# Patient Record
Sex: Female | Born: 1995 | Race: Black or African American | Hispanic: No | Marital: Single | Smoking: Never smoker
Health system: Southern US, Community
[De-identification: ages and names within clinical notes are randomized; demographics above are authoritative.]

## PROBLEM LIST (undated history)

## (undated) DIAGNOSIS — L309 Dermatitis, unspecified: Secondary | ICD-10-CM

## (undated) HISTORY — PX: HERNIA REPAIR: SHX51

---
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
## 2014-05-18 ENCOUNTER — Emergency Department (HOSPITAL_COMMUNITY)
Admission: EM | Admit: 2014-05-18 | Discharge: 2014-05-18 | Disposition: A | Discharge status: Home / Self Care | Payer: No Typology Code available for payment source | Attending: Emergency Medicine | Admitting: Emergency Medicine

## 2014-05-18 ENCOUNTER — Encounter (HOSPITAL_COMMUNITY): Payer: Self-pay | Admitting: Emergency Medicine

## 2014-05-18 DIAGNOSIS — S161XXA Strain of muscle, fascia and tendon at neck level, initial encounter: Secondary | ICD-10-CM | POA: Diagnosis not present

## 2014-05-18 DIAGNOSIS — Z872 Personal history of diseases of the skin and subcutaneous tissue: Secondary | ICD-10-CM | POA: Insufficient documentation

## 2014-05-18 DIAGNOSIS — Y9389 Activity, other specified: Secondary | ICD-10-CM | POA: Diagnosis not present

## 2014-05-18 DIAGNOSIS — S199XXA Unspecified injury of neck, initial encounter: Secondary | ICD-10-CM | POA: Diagnosis present

## 2014-05-18 DIAGNOSIS — Y92410 Unspecified street and highway as the place of occurrence of the external cause: Secondary | ICD-10-CM | POA: Insufficient documentation

## 2014-05-18 DIAGNOSIS — S3992XA Unspecified injury of lower back, initial encounter: Secondary | ICD-10-CM | POA: Insufficient documentation

## 2014-05-18 DIAGNOSIS — Z79818 Long term (current) use of other agents affecting estrogen receptors and estrogen levels: Secondary | ICD-10-CM | POA: Diagnosis not present

## 2014-05-18 MED ORDER — IBUPROFEN 800 MG PO TABS: 800.0000 mg | ORAL_TABLET | Freq: Three times a day (TID) | ORAL | Status: AC

## 2014-05-18 MED ORDER — CYCLOBENZAPRINE HCL 10 MG PO TABS: 10.0000 mg | ORAL_TABLET | Freq: Two times a day (BID) | ORAL | Status: AC | PRN

## 2014-05-18 NOTE — ED Notes (Signed)
Pt denies sx other than pain, denies nausea, sob, dizziness, numbness /tingling or other sx. [facility]

## 2014-05-18 NOTE — ED Provider Notes (Signed)
Medical screening examination/treatment/procedure(s) were performed by non-physician practitioner and as supervising physician I was immediately available for consultation/collaboration.   Ceazia Harb, MD 05/18/14 0615 
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
## 2014-05-18 NOTE — ED Notes (Signed)
EDPA into room 

## 2014-05-18 NOTE — ED Notes (Signed)
Out with steady gait, "ready to go", declines w/c, denies questions or needs.

## 2014-05-18 NOTE — Discharge Instructions (Signed)
Cervical Sprain °A cervical sprain is an injury in the neck in which the strong, fibrous tissues (ligaments) that connect your neck bones stretch or tear. Cervical sprains can range from mild to severe. Severe cervical sprains can cause the neck vertebrae to be unstable. This can lead to damage of the spinal cord and can result in serious nervous system problems. The amount of time it takes for a cervical sprain to get better depends on the cause and extent of the injury. Most cervical sprains heal in 1 to 3 weeks. °CAUSES  °Severe cervical sprains may be caused by:  °· Contact sport injuries (such as from football, rugby, wrestling, hockey, auto racing, gymnastics, diving, martial arts, or boxing).   °· Motor vehicle collisions.   °· Whiplash injuries. This is an injury from a sudden forward and backward whipping movement of the head and neck.  °· Falls.   °Mild cervical sprains may be caused by:  °· Being in an awkward position, such as while cradling a telephone between your ear and shoulder.   °· Sitting in a chair that does not offer proper support.   °· Working at a poorly designed computer station.   °· Looking up or down for long periods of time.   °SYMPTOMS  °· Pain, soreness, stiffness, or a burning sensation in the front, back, or sides of the neck. This discomfort may develop immediately after the injury or slowly, 24 hours or more after the injury.   °· Pain or tenderness directly in the middle of the back of the neck.   °· Shoulder or upper back pain.   °· Limited ability to move the neck.   °· Headache.   °· Dizziness.   °· Weakness, numbness, or tingling in the hands or arms.   °· Muscle spasms.   °· Difficulty swallowing or chewing.   °· Tenderness and swelling of the neck.   °DIAGNOSIS  °Most of the time your health care provider can diagnose a cervical sprain by taking your history and doing a physical exam. Your health care provider will ask about previous neck injuries and any known neck  problems, such as arthritis in the neck. X-rays may be taken to find out if there are any other problems, such as with the bones of the neck. Other tests, such as a CT scan or MRI, may also be needed.  °TREATMENT  °Treatment depends on the severity of the cervical sprain. Mild sprains can be treated with rest, keeping the neck in place (immobilization), and pain medicines. Severe cervical sprains are immediately immobilized. Further treatment is done to help with pain, muscle spasms, and other symptoms and may include: °· Medicines, such as pain relievers, numbing medicines, or muscle relaxants.   °· Physical therapy. This may involve stretching exercises, strengthening exercises, and posture training. Exercises and improved posture can help stabilize the neck, strengthen muscles, and help stop symptoms from returning.   °HOME CARE INSTRUCTIONS  °· Put ice on the injured area.   °¨ Put ice in a plastic bag.   °¨ Place a towel between your skin and the bag.   °¨ Leave the ice on for 15-20 minutes, 3-4 times a day.   °· If your injury was severe, you may have been given a cervical collar to wear. A cervical collar is a two-piece collar designed to keep your neck from moving while it heals. °¨ Do not remove the collar unless instructed by your health care provider. °¨ If you have long hair, keep it outside of the collar. °¨ Ask your health care provider before making any adjustments to your collar. Minor   adjustments may be required over time to improve comfort and reduce pressure on your chin or on the back of your head.  Ifyou are allowed to remove the collar for cleaning or bathing, follow your health care provider's instructions on how to do so safely.  Keep your collar clean by wiping it with mild soap and water and drying it completely. If the collar you have been given includes removable pads, remove them every 1-2 days and hand wash them with soap and water. Allow them to air dry. They should be completely  dry before you wear them in the collar.  If you are allowed to remove the collar for cleaning and bathing, wash and dry the skin of your neck. Check your skin for irritation or sores. If you see any, tell your health care provider.  Do not drive while wearing the collar.   Only take over-the-counter or prescription medicines for pain, discomfort, or fever as directed by your health care provider.   Keep all follow-up appointments as directed by your health care provider.   Keep all physical therapy appointments as directed by your health care provider.   Make any needed adjustments to your workstation to promote good posture.   Avoid positions and activities that make your symptoms worse.   Warm up and stretch before being active to help prevent problems.  SEEK MEDICAL CARE IF:   Your pain is not controlled with medicine.   You are unable to decrease your pain medicine over time as planned.   Your activity level is not improving as expected.  SEEK IMMEDIATE MEDICAL CARE IF:   You develop any bleeding.  You develop stomach upset.  You have signs of an allergic reaction to your medicine.   Your symptoms get worse.   You develop new, unexplained symptoms.   You have numbness, tingling, weakness, or paralysis in any part of your body.  MAKE SURE YOU:   Understand these instructions.  Will watch your condition.  Will get help right away if you are not doing well or get worse. Document Released: 05/14/2007 Document Revised: 07/22/2013 Document Reviewed: 01/22/2013 [facility] LP[facility] Patient Information 2015 [place] Center[place]Care, [place]. This information is not intended to replace advice given to you by your health care provider. Make sure you discuss any questions you have with your health care provider. Motor Vehicle Collision It is common to have multiple bruises and sore muscles after a motor vehicle collision (MVC). These tend to feel worse for the first 24 hours. You may have the  most stiffness and soreness over the first several hours. You may also feel worse when you wake up the first morning after your collision. After this point, you will usually begin to improve with each day. The speed of improvement often depends on the severity of the collision, the number of injuries, and the location and nature of these injuries. HOME CARE INSTRUCTIONS  Put ice on the injured area.  Put ice in a plastic bag.  Place a towel between your skin and the bag.  Leave the ice on for 15-20 minutes, 3-4 times a day, or as directed by your health care provider.  Drink enough fluids to keep your urine clear or pale yellow. Do not drink alcohol.  Take a warm shower or bath once or twice a day. This will increase blood flow to sore muscles.  You may return to activities as directed by your caregiver. Be careful when lifting, as this may aggravate neck or back pain.  Only take over-the-counter or prescription medicines for pain, discomfort, or fever as directed by your caregiver. Do not use aspirin. This may increase bruising and bleeding. SEEK IMMEDIATE MEDICAL CARE IF:  You have numbness, tingling, or weakness in the arms or legs.  You develop severe headaches not relieved with medicine.  You have severe neck pain, especially tenderness in the middle of the back of your neck.  You have changes in bowel or bladder control.  There is increasing pain in any area of the body.  You have shortness of breath, light-headedness, dizziness, or fainting.  You have chest pain.  You feel sick to your stomach (nauseous), throw up (vomit), or sweat.  You have increasing abdominal discomfort.  There is blood in your urine, stool, or vomit.  You have pain in your shoulder (shoulder strap areas).  You feel your symptoms are getting worse. MAKE SURE YOU:  Understand these instructions.  Will watch your condition.  Will get help right away if you are not doing well or get  worse. Document Released: 07/17/2005 Document Revised: 12/01/2013 Document Reviewed: 12/14/2010 [facility] Patient Information 2015 [place], [place]. This information is not intended to replace advice given to you by your health care provider. Make sure you discuss any questions you have with your health care provider. Heat Therapy Heat therapy can help ease sore, stiff, injured, and tight muscles and joints. Heat relaxes your muscles, which may help ease your pain.  RISKS AND COMPLICATIONS If you have any of the following conditions, do not use heat therapy unless your health care provider has approved:  Poor circulation.  Healing wounds or scarred skin in the area being treated.  Diabetes, heart disease, or high blood pressure.  Not being able to feel (numbness) the area being treated.  Unusual swelling of the area being treated.  Active infections.  Blood clots.  Cancer.  Inability to communicate pain. This may include young children and people who have problems with their brain function (dementia).  Pregnancy. Heat therapy should only be used on old, pre-existing, or long-lasting (chronic) injuries. Do not use heat therapy on new injuries unless directed by your health care provider. HOW TO USE HEAT THERAPY There are several different kinds of heat therapy, including:  Moist heat pack.  Warm water bath.  Hot water bottle.  Electric heating pad.  Heated gel pack.  Heated wrap.  Electric heating pad. Use the heat therapy method suggested by your health care provider. Follow your health care provider's instructions on when and how to use heat therapy. GENERAL HEAT THERAPY RECOMMENDATIONS  Do not sleep while using heat therapy. Only use heat therapy while you are awake.  Your skin may turn pink while using heat therapy. Do not use heat therapy if your skin turns red.  Do not use heat therapy if you have new pain.  High heat or long exposure to heat can cause burns.  Be careful when using heat therapy to avoid burning your skin.  Do not use heat therapy on areas of your skin that are already irritated, such as with a rash or sunburn. SEEK MEDICAL CARE IF:  You have blisters, redness, swelling, or numbness.  You have new pain.  Your pain is worse. MAKE SURE YOU:  Understand these instructions.  Will watch your condition.  Will get help right away if you are not doing well or get worse. Document Released: 10/09/2011 Document Revised: 12/01/2013 Document Reviewed: 09/09/2013 [facility]oscopyExit[facility] Patient Information 2015 [place]ExitCare, [place]. This information is  not intended to replace advice given to you by your health care provider. Make sure you discuss any questions you have with your health care provider. ° °
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
## 2016-06-03 ENCOUNTER — Emergency Department (HOSPITAL_COMMUNITY): Payer: BLUE CROSS/BLUE SHIELD

## 2016-06-03 ENCOUNTER — Encounter (HOSPITAL_COMMUNITY): Payer: Self-pay | Admitting: Emergency Medicine

## 2016-06-03 ENCOUNTER — Emergency Department (HOSPITAL_COMMUNITY)
Admission: EM | Admit: 2016-06-03 | Discharge: 2016-06-03 | Disposition: A | Discharge status: Home / Self Care | Payer: BLUE CROSS/BLUE SHIELD | Attending: Emergency Medicine | Admitting: Emergency Medicine

## 2016-06-03 DIAGNOSIS — K5900 Constipation, unspecified: Secondary | ICD-10-CM | POA: Diagnosis not present

## 2016-06-03 DIAGNOSIS — R102 Pelvic and perineal pain: Secondary | ICD-10-CM

## 2016-06-03 DIAGNOSIS — R1031 Right lower quadrant pain: Secondary | ICD-10-CM | POA: Diagnosis present

## 2016-06-03 LAB — CBC
HCT: 44.4 % (ref 36.0–46.0)
Hemoglobin: 14.5 g/dL (ref 12.0–15.0)
MCH: 26.1 pg (ref 26.0–34.0)
MCHC: 32.7 g/dL (ref 30.0–36.0)
MCV: 79.9 fL (ref 78.0–100.0)
PLATELETS: 301 10*3/uL (ref 150–400)
RBC: 5.56 MIL/uL — AB (ref 3.87–5.11)
RDW: 14.9 % (ref 11.5–15.5)
WBC: 9.2 10*3/uL (ref 4.0–10.5)

## 2016-06-03 LAB — COMPREHENSIVE METABOLIC PANEL
ALK PHOS: 80 U/L (ref 38–126)
ALT: 11 U/L — ABNORMAL LOW (ref 14–54)
AST: 17 U/L (ref 15–41)
Albumin: 3.1 g/dL — ABNORMAL LOW (ref 3.5–5.0)
Anion gap: 9 (ref 5–15)
BUN: 7 mg/dL (ref 6–20)
CALCIUM: 10 mg/dL (ref 8.9–10.3)
CHLORIDE: 104 mmol/L (ref 101–111)
CO2: 24 mmol/L (ref 22–32)
CREATININE: 0.83 mg/dL (ref 0.44–1.00)
Glucose, Bld: 99 mg/dL (ref 65–99)
Potassium: 3.9 mmol/L (ref 3.5–5.1)
Sodium: 137 mmol/L (ref 135–145)
Total Bilirubin: 0.4 mg/dL (ref 0.3–1.2)
Total Protein: 7.1 g/dL (ref 6.5–8.1)

## 2016-06-03 LAB — URINE MICROSCOPIC-ADD ON

## 2016-06-03 LAB — URINALYSIS, ROUTINE W REFLEX MICROSCOPIC
Bilirubin Urine: NEGATIVE
GLUCOSE, UA: NEGATIVE mg/dL
KETONES UR: NEGATIVE mg/dL
Leukocytes, UA: NEGATIVE
Nitrite: NEGATIVE
PROTEIN: NEGATIVE mg/dL
Specific Gravity, Urine: 1.018 (ref 1.005–1.030)
pH: 6.5 (ref 5.0–8.0)

## 2016-06-03 LAB — WET PREP, GENITAL
CLUE CELLS WET PREP: NONE SEEN
SPERM: NONE SEEN
TRICH WET PREP: NONE SEEN
YEAST WET PREP: NONE SEEN

## 2016-06-03 LAB — I-STAT BETA HCG BLOOD, ED (MC, WL, AP ONLY)

## 2016-06-03 LAB — LIPASE, BLOOD: Lipase: 25 U/L (ref 11–51)

## 2016-06-03 MED ORDER — MORPHINE SULFATE (PF) 4 MG/ML IV SOLN
4.0000 mg | Freq: Once | INTRAVENOUS | Status: AC
  Administered 2016-06-03: 4 mg via INTRAVENOUS
  Filled 2016-06-03: qty 1

## 2016-06-03 MED ORDER — IOPAMIDOL (ISOVUE-300) INJECTION 61%
INTRAVENOUS | Status: AC
  Administered 2016-06-03: 100 mL
  Filled 2016-06-03: qty 100

## 2016-06-03 MED ORDER — POLYETHYLENE GLYCOL 3350 17 G PO PACK
17.0000 g | PACK | Freq: Every day | ORAL | 0 refills | Status: AC
Start: 2016-06-03 — End: ?

## 2016-06-03 MED ORDER — ONDANSETRON HCL 4 MG/2ML IJ SOLN
4.0000 mg | Freq: Once | INTRAMUSCULAR | Status: AC
  Administered 2016-06-03: 4 mg via INTRAVENOUS
  Filled 2016-06-03: qty 2

## 2016-06-03 MED ORDER — SODIUM CHLORIDE 0.9 % IV BOLUS (SEPSIS)
1000.0000 mL | Freq: Once | INTRAVENOUS | Status: AC
  Administered 2016-06-03: 1000 mL via INTRAVENOUS

## 2016-06-03 MED ORDER — DICYCLOMINE HCL 10 MG PO CAPS: 10.0000 mg | ORAL_CAPSULE | Freq: Three times a day (TID) | ORAL | 0 refills | Status: AC

## 2016-06-03 MED ORDER — METOCLOPRAMIDE HCL 10 MG PO TABS: 10.0000 mg | ORAL_TABLET | Freq: Three times a day (TID) | ORAL | 0 refills | Status: AC

## 2016-06-03 NOTE — ED Provider Notes (Signed)
[facility]-EMER[facility]ENCY DEPT Provider Note   CSN: [id number] Arrival date & time: 06/03/16  0844  History   Chief Complaint Chief Complaint  Patient presents with  . Abdominal Pain  . Emesis    HPI Erica Cochran is a 20 y.o. female presenting with emesis and abdominal pain.  HPI  Reports she woke up this morning around 7am with abdominal pain. Went to the restroom, but did not help with pain. Could not get comfortable. Pain worsened and moved from area of umbilicus to right lower quadrant. Pain is intermittent and sharp. Reports appetite is normal, but she became nauseous and vomited on arrival to the ED. Denies fever. Sexually active, but reports she uses condoms and birth control. Currently on her period. Denies diarrhea. Last bowel movement yesterday, normal consistency.   Past Medical History:  Diagnosis Date  . Eczema     There are no active problems to display for this patient.   Past Surgical History:  Procedure Laterality Date  . HERNIA REPAIR      OB History    No data available      Home Medications    Prior to Admission medications   Medication Sig Start Date End Date Taking? Authorizing Provider  Benzocaine (BOIL-EASE EX) Apply 1 application topically as needed (inner thighs).   Yes Historical Provider, MD  cyclobenzaprine (FLEXERIL) 10 MG tablet Take 1 tablet (10 mg total) by mouth 2 (two) times daily as needed for muscle spasms. Patient not taking: Reported on 06/03/2016 05/18/14   Elpidio Anis, PA-C  dicyclomine (BENTYL) 10 MG capsule Take 1 capsule (10 mg total) by mouth 4 (four) times daily -  before meals and at bedtime. 06/03/16   [place] N Rumley, DO  ibuprofen (ADVIL,MOTRIN) 800 MG tablet Take 1 tablet (800 mg total) by mouth 3 (three) times daily. Patient not taking: Reported on 06/03/2016 05/18/14   Elpidio Anis, PA-C  metoCLOPramide (REGLAN) 10 MG tablet Take 1 tablet (10 mg total) by mouth 4 (four) times daily -  before meals and at bedtime. 06/03/16    [place] N Rumley, DO  Norgestimate-Ethinyl Estradiol Triphasic (TRINESSA, 28,) 0.18/0.215/0.25 MG-35 MCG tablet Take 1 tablet by mouth daily.    Historical Provider, MD  polyethylene glycol (MIRALAX / GLYCOLAX) packet Take 17 g by mouth daily. 06/03/16   Araceli Bouche, DO    Family History No family history on file.  Social History Social History  Substance Use Topics  . Smoking status: Never Smoker  . Smokeless tobacco: Never Used  . Alcohol use 4.8 oz/week    8 Shots of liquor per week     Allergies   Sulfa antibiotics   Review of Systems Review of Systems  Constitutional: Negative for appetite change and fever.  Respiratory: Negative for shortness of breath.   Gastrointestinal: Positive for abdominal pain, nausea and vomiting. Negative for constipation and diarrhea.  Genitourinary: Negative for dysuria, menstrual problem, vaginal discharge and vaginal pain.     Physical Exam Updated Vital Signs BP 122/77   Pulse 92   Resp 17   Ht 5\' 3"  (1.6 m)   Wt 95.3 kg   LMP 05/27/2016 (Exact Date)   SpO2 100%   BMI 37.20 kg/m   Physical Exam  Constitutional: She appears well-developed and well-nourished.  Intermittently tearful when pain occurs  HENT:  Head: Normocephalic and atraumatic.  Cardiovascular: Normal rate and regular rhythm.  Exam reveals no friction rub.   No murmur heard. Pulmonary/Chest: Effort normal. No respiratory distress.  She has no wheezes.  Abdominal: Soft. Bowel sounds are normal. She exhibits no distension.  Diffuse tenderness worse over McBurney's. Positive Murphy's. Rovsing sign positive. Negative rebound. Positive Obturator sign, negative Psoas sign.  Genitourinary:  Genitourinary Comments: Cervix normal with mucous/bloody discharge from os (currently on period). No cervical motion tenderness. No adnexal tenderness. No vaginal wall tenderness.   Musculoskeletal: She exhibits no edema.  Lymphadenopathy:    She has no cervical adenopathy.    Skin: No rash noted.  Psychiatric: She has a normal mood and affect. Her behavior is normal.    ED Treatments / Results  Labs (all labs ordered are listed, but only abnormal results are displayed) Labs Reviewed  WET PREP, GENITAL - Abnormal; Notable for the following:       Result Value   WBC, Wet Prep HPF POC MODERATE (*)    All other components within normal limits  COMPREHENSIVE METABOLIC PANEL - Abnormal; Notable for the following:    Albumin 3.1 (*)    ALT 11 (*)    All other components within normal limits  CBC - Abnormal; Notable for the following:    RBC 5.56 (*)    All other components within normal limits  URINALYSIS, ROUTINE W REFLEX MICROSCOPIC (NOT AT [facility]) - Abnormal; Notable for the following:    Hgb urine dipstick MODERATE (*)    All other components within normal limits  URINE MICROSCOPIC-ADD ON - Abnormal; Notable for the following:    Squamous Epithelial / LPF 0-5 (*)    Bacteria, UA RARE (*)    All other components within normal limits  LIPASE, BLOOD  I-STAT BETA HCG BLOOD, ED ([facility], [facility], [facility] ONLY)  CERVICOVAGINAL ANCILLARY ONLY   EKG  EKG Interpretation None       Radiology [place] Transvaginal Non-ob  Result Date: 06/03/2016 CLINICAL DATA:  Ultrasound was provided for use by the ordering physician, and a technical charge was applied by the performing facility.  No radiologist interpretation/professional services rendered.   [place] Pelvis Complete  Result Date: 06/03/2016 CLINICAL DATA:  Menorrhagia.  Diffuse pelvic pain. EXAM: TRANSABDOMINAL AND TRANSVAGINAL ULTRASOUND OF PELVIS DOPPLER ULTRASOUND OF OVARIES TECHNIQUE: Both transabdominal and transvaginal ultrasound examinations of the pelvis were performed. Transabdominal technique was performed for global imaging of the pelvis including uterus, ovaries, adnexal regions, and pelvic cul-de-sac. It was necessary to proceed with endovaginal exam following the transabdominal exam to visualize the endometrium and  ovaries. Color and duplex Doppler ultrasound was utilized to evaluate blood flow to the ovaries. COMPARISON:  Pelvic CT 06/03/2016 FINDINGS: Uterus Measurements: 7.7 x 2.5 x 5.2 cm. No fibroids or other mass visualized. Normal anteverted position of the uterus. Endometrium Thickness: 0.8 cm.  No focal abnormality visualized. Right ovary Measurements: 3.4 x 2.2 x 1.3 cm. Normal appearance/no adnexal mass. Left ovary Measurements: 1.8 x 1.2 x 2.0 cm. Normal appearance/no adnexal mass. Pulsed Doppler evaluation of both ovaries demonstrates normal low-resistance arterial and venous waveforms. Other findings No significant free fluid. IMPRESSION: Normal pelvic ultrasound.  No evidence for ovarian torsion. Electronically Signed   By: Richarda OverlieAdam  Henn M.D.   On: 06/03/2016 13:38   Ct Abdomen Pelvis W Contrast  Result Date: 06/03/2016 CLINICAL DATA:  Right lower quadrant pain. EXAM: CT ABDOMEN AND PELVIS WITH CONTRAST TECHNIQUE: Multidetector CT imaging of the abdomen and pelvis was performed using the standard protocol following bolus administration of intravenous contrast. CONTRAST:  100mL ISOVUE-300 IOPAMIDOL (ISOVUE-300) INJECTION 61% COMPARISON:  None FINDINGS: Lower chest: Lung bases are  clear.  No pleural effusions. Hepatobiliary: Normal appearance of the liver, gallbladder and portal venous system. Pancreas: Normal appearance of the pancreas without inflammation or duct dilatation. Spleen: Normal appearance of spleen without enlargement. Adrenals/Urinary Tract: Normal adrenal glands. Small amount of contrast within the renal calices and limited evaluation for kidney stones. No suspicious renal lesions and no hydronephrosis. Left ureter jet is seen. Urinary bladder is unremarkable. Stomach/Bowel: Normal appearance of the stomach and duodenum. Normal appendix. No evidence for bowel dilatation or focal inflammation. Vascular/Lymphatic: No significant vascular findings are present. No enlarged abdominal or pelvic lymph  nodes. Reproductive: There is fluid surrounding the uterus and there is mild fat stranding just anterior to the uterine fundus. No gross abnormality to the adnexal structures. Incidentally, an intravaginal tampon device present. Other: Small amount of free fluid in the pelvis, particularly around the uterus. No significant abdominal wall hernia. Musculoskeletal: No acute bone abnormality. IMPRESSION: Small amount of stranding and fluid surrounding the uterus. Fluid could be physiologic but cannot exclude pelvic inflammatory disease and recommend clinical correlation in this area. If this is the area of concern, consider further imaging with pelvic ultrasound. Electronically Signed   By: Richarda Overlie M.D.   On: 06/03/2016 11:00   [place] Art/ven Flow Abd Pelv Doppler  Result Date: 06/03/2016 CLINICAL DATA:  Menorrhagia.  Diffuse pelvic pain. EXAM: TRANSABDOMINAL AND TRANSVAGINAL ULTRASOUND OF PELVIS DOPPLER ULTRASOUND OF OVARIES TECHNIQUE: Both transabdominal and transvaginal ultrasound examinations of the pelvis were performed. Transabdominal technique was performed for global imaging of the pelvis including uterus, ovaries, adnexal regions, and pelvic cul-de-sac. It was necessary to proceed with endovaginal exam following the transabdominal exam to visualize the endometrium and ovaries. Color and duplex Doppler ultrasound was utilized to evaluate blood flow to the ovaries. COMPARISON:  Pelvic CT 06/03/2016 FINDINGS: Uterus Measurements: 7.7 x 2.5 x 5.2 cm. No fibroids or other mass visualized. Normal anteverted position of the uterus. Endometrium Thickness: 0.8 cm.  No focal abnormality visualized. Right ovary Measurements: 3.4 x 2.2 x 1.3 cm. Normal appearance/no adnexal mass. Left ovary Measurements: 1.8 x 1.2 x 2.0 cm. Normal appearance/no adnexal mass. Pulsed Doppler evaluation of both ovaries demonstrates normal low-resistance arterial and venous waveforms. Other findings No significant free fluid. IMPRESSION:  Normal pelvic ultrasound.  No evidence for ovarian torsion. Electronically Signed   By: Richarda Overlie M.D.   On: 06/03/2016 13:38    Procedures Procedures (including critical care time)  Medications Ordered in ED Medications  sodium chloride 0.9 % bolus 1,000 mL (0 mLs Intravenous Stopped 06/03/16 1232)  morphine 4 MG/ML injection 4 mg (4 mg Intravenous Given 06/03/16 0937)  ondansetron (ZOFRAN) injection 4 mg (4 mg Intravenous Given 06/03/16 0938)  iopamidol (ISOVUE-300) 61 % injection (100 mLs  Contrast Given 06/03/16 1028)     Initial Impression / Assessment and Plan / ED Course  I have reviewed the triage vital signs and the nursing notes.  Pertinent labs & imaging results that were available during my care of the patient were reviewed by me and considered in my medical decision making (see chart for details).  Clinical Course  - CBC with normal white count at 9.2, hemoglobin normal at 14.5. - CMP normal except for mildly low albumin at 3.1. Lipase normal at 25.  - Pregnancy negative - Urinalysis with moderate hemoglobin, currently on period. - Wet Prep with moderate leukocytes, otherwise normal.  - GC/Chlamydia obtained and pending.  - CT Scan Abdomen and Pelvis with Contrast with fluid and stranding  surrounding uterus, which may be physiologic but could also indicate PID. Normal appendix. Stool burden on right. Recommended Pelvic US to further evaluate.  - Pelvic US normal including Transvagional and Doppler views.   - Made NPO on arrival to ED. - 1L NS bolus given. Morphine and Zofran as needed for pain and nausea respectively.  - Differential includes Appendicitis (McBurney's Tenderness, transition for umbilicus pain to RLQ pain, Obturator sign; CT with normal appendix), PID (fluid surrounding uterus on CT but none on US, no cervical motion tenderness, WBC on wet prep, GC/Chlamydia pending), constipation (stool burden noted over right abdomen, no signs of obstruction). Suspect  constipation as most likely given labs and imaging.   Final Clinical Impressions(s) / ED Diagnoses   Final diagnoses:  Constipation, unspecified constipation type  Workup unremarkable. Vitals stable. CT Scan with some stool burden on right side of abdomen. Suspect pain secondary to constipation. GC/Chlamycia pending at discharge. Prescription for Miralax, Reglan, and Bentyl given. Strict return precautions given, including fever, worsening abdominal pain, vomiting, lack of appetite. [facility].   New Prescriptions New Prescriptions   DICYCLOMINE (BENTYL) 10 MG CAPSULE    Take 1 capsule (10 mg total) by mouth 4 (four) times daily -  before meals and at bedtime.   METOCLOPRAMIDE (REGLAN) 10 MG TABLET    Take 1 tablet (10 mg total) by mouth 4 (four) times daily -  before meals and at bedtime.   POLYETHYLENE GLYCOL (MIRALAX / GLYCOLAX) PACKET    Take 17 g by mouth daily.     Lora HavensRaleigh N Green HarborRumley, [place]DO 06/03/16 1418    Loren Raceravid Yelverton, MD 06/05/16 1534

## 2016-06-03 NOTE — ED Notes (Signed)
Patient transported to CT 

## 2016-06-03 NOTE — ED Triage Notes (Signed)
Pt arrives from home c/o R sided abd pain with one episode of emesis.  Pt reports unable to have a BM today, reports normal BM last night.  Pt reports "some" ETOH use last night, denies excessive intake.  Pt reports LMP 05/28/16. Pt tearful at triage.

## 2016-06-03 NOTE — ED Notes (Signed)
Patient transported to Ultrasound 

## 2016-06-03 NOTE — Discharge Instructions (Signed)
Please follow up at [facility]. If you develop worsening abdominal pain, fevers, lack of appetite, or vomiting, please return for further evaluation.

## 2016-06-05 LAB — CERVICOVAGINAL ANCILLARY ONLY
CHLAMYDIA, DNA PROBE: NEGATIVE
NEISSERIA GONORRHEA: NEGATIVE

## 2016-10-20 ENCOUNTER — Emergency Department (HOSPITAL_COMMUNITY)
Admission: EM | Admit: 2016-10-20 | Discharge: 2016-10-20 | Disposition: A | Discharge status: Home / Self Care | Payer: BLUE CROSS/BLUE SHIELD | Attending: Emergency Medicine | Admitting: Emergency Medicine

## 2016-10-20 ENCOUNTER — Encounter (HOSPITAL_COMMUNITY): Payer: Self-pay | Admitting: Emergency Medicine

## 2016-10-20 DIAGNOSIS — J029 Acute pharyngitis, unspecified: Secondary | ICD-10-CM | POA: Insufficient documentation

## 2016-10-20 LAB — RAPID STREP SCREEN (MED CTR MEBANE ONLY): STREPTOCOCCUS, GROUP A SCREEN (DIRECT): NEGATIVE

## 2016-10-20 MED ORDER — KETOROLAC TROMETHAMINE 60 MG/2ML IM SOLN
60.0000 mg | Freq: Once | INTRAMUSCULAR | Status: AC
  Administered 2016-10-20: 60 mg via INTRAMUSCULAR
  Filled 2016-10-20: qty 2

## 2016-10-20 MED ORDER — LIDOCAINE VISCOUS 2 % MT SOLN
15.0000 mL | Freq: Once | OROMUCOSAL | Status: AC
  Administered 2016-10-20: 15 mL via OROMUCOSAL
  Filled 2016-10-20: qty 15

## 2016-10-20 MED ORDER — DEXAMETHASONE SODIUM PHOSPHATE 10 MG/ML IJ SOLN
10.0000 mg | Freq: Once | INTRAMUSCULAR | Status: AC
  Administered 2016-10-20: 10 mg via INTRAMUSCULAR
  Filled 2016-10-20: qty 1

## 2016-10-20 NOTE — ED Provider Notes (Signed)
[facility]PT Provider Note   CSN: [id number] Arrival date & time: 10/20/16  0149   By signing my name below, I, Clovis PuAvnee Patel, attest that this documentation has been prepared under the direction and in the presence of Tomasita CrumbleAdeleke Quantavia Frith, MD  Electronically Signed: Clovis PuAvnee Patel, ED Scribe. 10/20/16. 3:05 AM.   History   Chief Complaint Chief Complaint  Patient presents with  . Sore Throat   The history is provided by the patient. No language interpreter was used.   HPI Comments:  Erica Cochran is a 21 y.o. female who presents to the Emergency Department complaining of acute onset, intermittent sore throat x 1 week which worsened today. She also reports pain with swallowing and a resolved cough. No alleviating factors noted. Pt denies any other associated symptoms. No other complaints noted.   Past Medical History:  Diagnosis Date  . Eczema     There are no active problems to display for this patient.   Past Surgical History:  Procedure Laterality Date  . HERNIA REPAIR      OB History    No data available       Home Medications    Prior to Admission medications   Medication Sig Start Date End Date Taking? Authorizing Provider  Benzocaine (BOIL-EASE EX) Apply 1 application topically as needed (inner thighs).    Historical Provider, MD  cyclobenzaprine (FLEXERIL) 10 MG tablet Take 1 tablet (10 mg total) by mouth 2 (two) times daily as needed for muscle spasms. Patient not taking: Reported on 06/03/2016 05/18/14   Elpidio AnisShari Upstill, PA-C  dicyclomine (BENTYL) 10 MG capsule Take 1 capsule (10 mg total) by mouth 4 (four) times daily -  before meals and at bedtime. 06/03/16   [place] N Rumley, DO  ibuprofen (ADVIL,MOTRIN) 800 MG tablet Take 1 tablet (800 mg total) by mouth 3 (three) times daily. Patient not taking: Reported on 06/03/2016 05/18/14   Elpidio AnisShari Upstill, PA-C  metoCLOPramide (REGLAN) 10 MG tablet Take 1 tablet (10 mg total) by mouth 4 (four) times daily -  before meals and  at bedtime. 06/03/16   [place] N Rumley, DO  Norgestimate-Ethinyl Estradiol Triphasic (TRINESSA, 28,) 0.18/0.215/0.25 MG-35 MCG tablet Take 1 tablet by mouth daily.    Historical Provider, MD  polyethylene glycol (MIRALAX / GLYCOLAX) packet Take 17 g by mouth daily. 06/03/16   Araceli Bouchealeigh N Rumley, DO    Family History No family history on file.  Social History Social History  Substance Use Topics  . Smoking status: Never Smoker  . Smokeless tobacco: Never Used  . Alcohol use 4.8 oz/week    8 Shots of liquor per week     Allergies   Sulfa antibiotics   Review of Systems Review of Systems 10 Systems reviewed and are negative for acute change except as noted in the HPI.  Physical Exam Updated Vital Signs BP 133/88 (BP Location: Left Arm)   Pulse 83   Temp 98.3 F (36.8 C) (Oral)   Resp 18   Ht 5\' 3"  (1.6 m)   Wt 210 lb (95.3 kg)   LMP 09/29/2016 (Approximate)   SpO2 100%   BMI 37.20 kg/m   Physical Exam  Constitutional: She is oriented to person, place, and time. She appears well-developed and well-nourished. No distress.  HENT:  Head: Normocephalic and atraumatic.  Nose: Nose normal.  Mouth/Throat: Oropharynx is clear and moist. No oropharyngeal exudate.  bilateral tonsillar hypertrophy    Eyes: Conjunctivae and EOM are normal. Pupils are equal, round, and  reactive to light. No scleral icterus.  Neck: Normal range of motion. Neck supple. No JVD present. No tracheal deviation present. No thyromegaly present.  Cardiovascular: Normal rate, regular rhythm and normal heart sounds.  Exam reveals no gallop and no friction rub.   No murmur heard. Pulmonary/Chest: Effort normal and breath sounds normal. No respiratory distress. She has no wheezes. She exhibits no tenderness.  Abdominal: Soft. Bowel sounds are normal. She exhibits no distension and no mass. There is no tenderness. There is no rebound and no guarding.  Musculoskeletal: Normal range of motion. She exhibits no edema  or tenderness.  Lymphadenopathy:    She has no cervical adenopathy.  Neurological: She is alert and oriented to person, place, and time. No cranial nerve deficit. She exhibits normal muscle tone.  Skin: Skin is warm and dry. No rash noted. No erythema. No pallor.  Nursing note and vitals reviewed.    ED Treatments / Results  DIAGNOSTIC STUDIES:  Oxygen Saturation is 100% on RA, normal by my interpretation.    COORDINATION OF CARE:  3:03 AM Discussed treatment plan with pt at bedside and pt agreed to plan.  Labs (all labs ordered are listed, but only abnormal results are displayed) Labs Reviewed  RAPID STREP SCREEN (NOT AT [facility])  CULTURE, GROUP A STREP [facility])    EKG  EKG Interpretation None       Radiology No results found.  Procedures Procedures (including critical care time)  Medications Ordered in ED Medications - No data to display   Initial Impression / Assessment and Plan / ED Course  I have reviewed the triage vital signs and the nursing notes.  Pertinent labs & imaging results that were available during my care of the patient were reviewed by me and considered in my medical decision making (see chart for details).     Patient presents to the ED for sore throat.  Swollen tonsils on exam.  No exudates.  Will give toradol, decadron and viscous lidocaine for supportive care.  Strep is negative.  Ibuprofen rec at home for pain. PCP fu within 3 days. She appears well and in NAD. VS are normal. Patient is safe for Dc.      Final Clinical Impressions(s) / ED Diagnoses   Final diagnoses:  None    New Prescriptions New Prescriptions   No medications on file    I personally performed the services described in this documentation, which was scribed in my presence. The recorded information has been reviewed and is accurate.       Tomasita Crumble, MD 10/20/16 [phone]

## 2016-10-20 NOTE — ED Triage Notes (Signed)
Patient reports sore throat with swelling and dry cough onset last week , denies SOB , no fever or chills.

## 2016-10-22 LAB — CULTURE, GROUP A STREP (THRC)

## 2018-04-21 IMAGING — CT CT ABD-PELV W/ CM
2 of 4 series · 15 of 46 positions shown, 17 images · IV contrast (Omni 300)
Comparison: None

CLINICAL DATA: Right lower quadrant pain.

EXAM:
CT ABDOMEN AND PELVIS WITH CONTRAST
TECHNIQUE: Multidetector CT imaging of the abdomen and pelvis was performed
using the standard protocol following bolus administration of
intravenous contrast.
CONTRAST:  100mL [id number]-PAA IOPAMIDOL ([id number]-PAA) INJECTION 61%

[Series 2: a/p w/ 5mm · axial · 0.59mm/px · z∈[+944,+1384]mm · 12 of 97 slices shown, 14 images]
[im 5/97  soft-tissue]
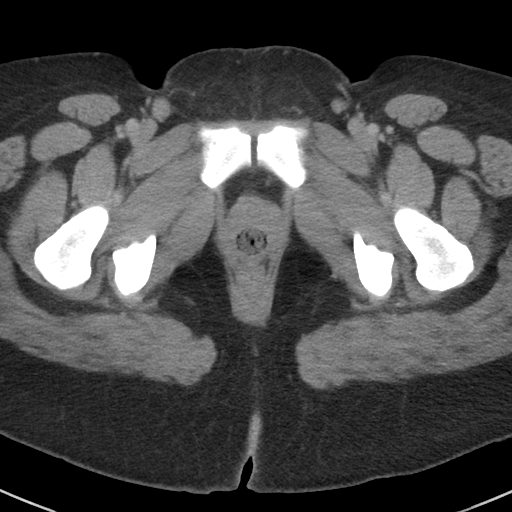
[im 5/97  bone]
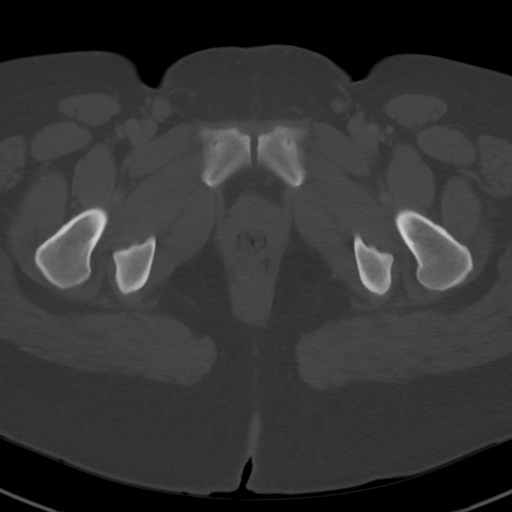
[im 13/97  soft-tissue]
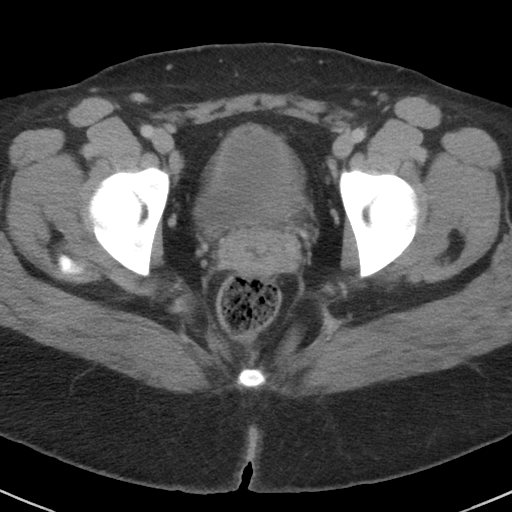
[im 21/97  soft-tissue]
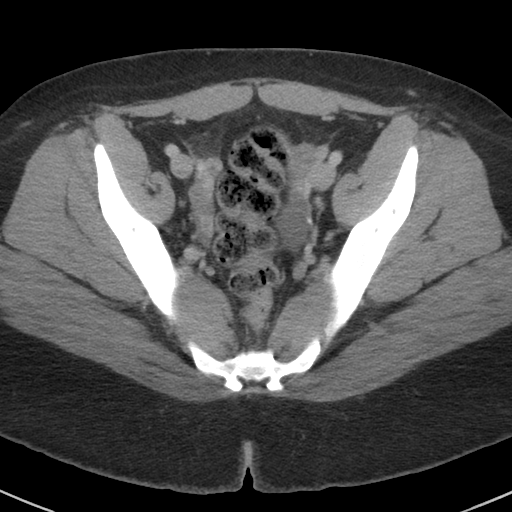
[im 29/97  soft-tissue]
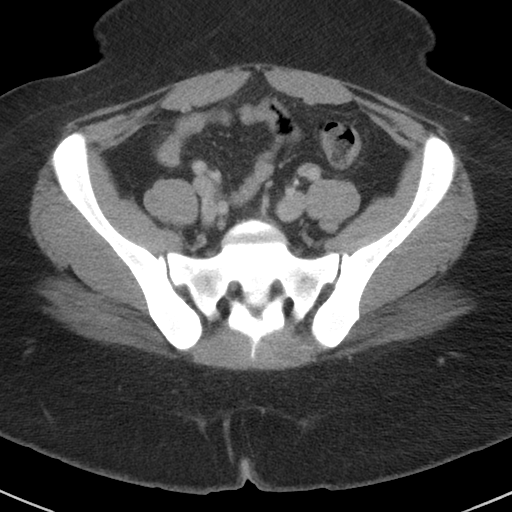
[im 37/97  soft-tissue]
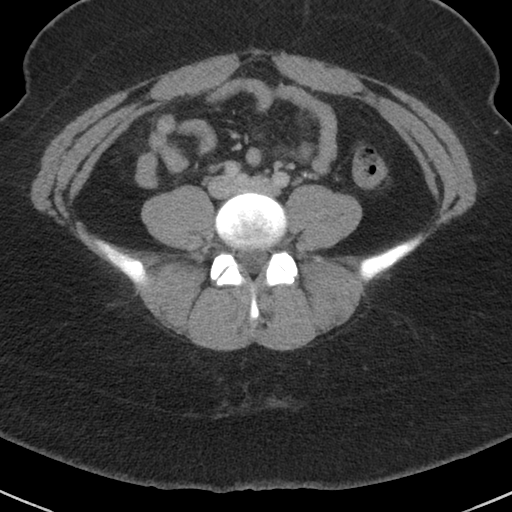
[im 45/97  soft-tissue]
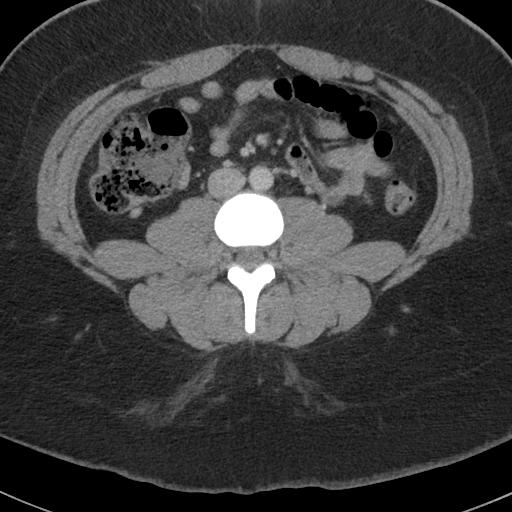
[im 53/97  soft-tissue]
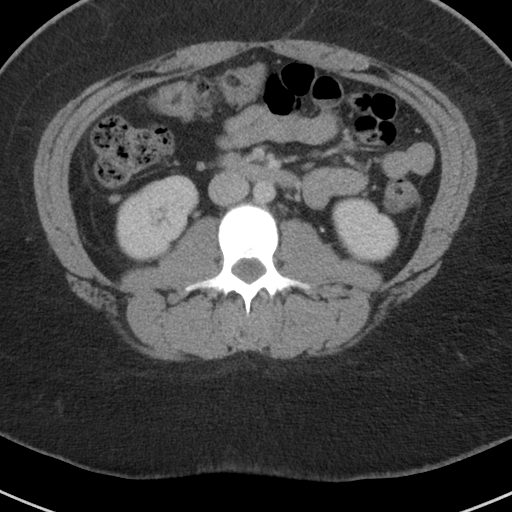
[im 61/97  soft-tissue]
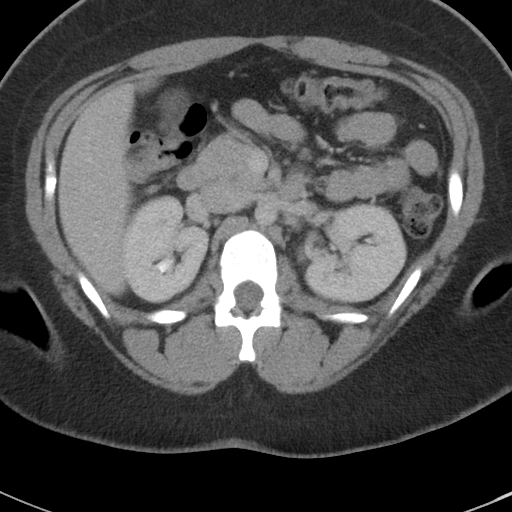
[im 69/97  soft-tissue]
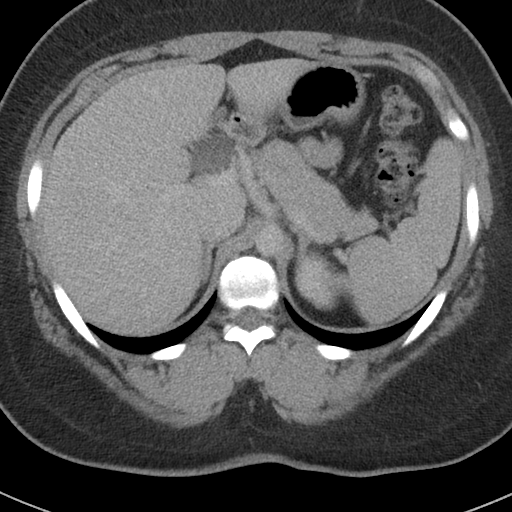
[im 69/97  bone]
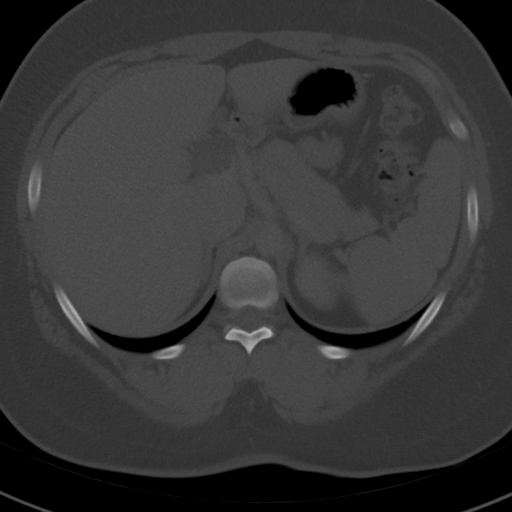
[im 77/97  soft-tissue]
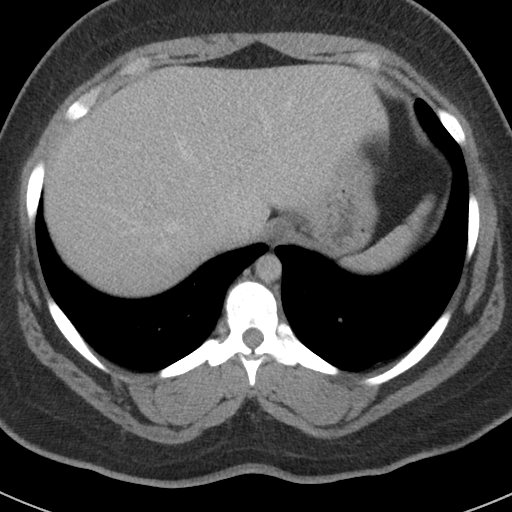
[im 85/97  soft-tissue]
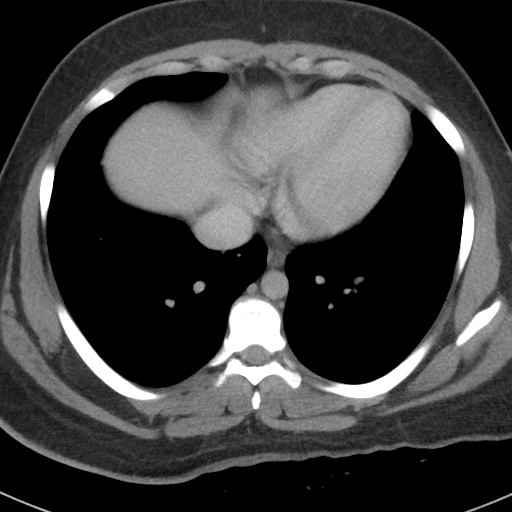
[im 93/97  soft-tissue]
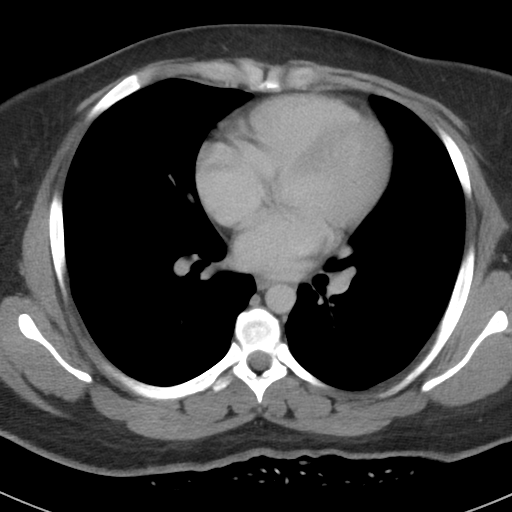

[Series 5: a/p w/ cor · coronal · 0.58mm/px · 3 of 151 slices shown]
[im 51/151  soft-tissue]
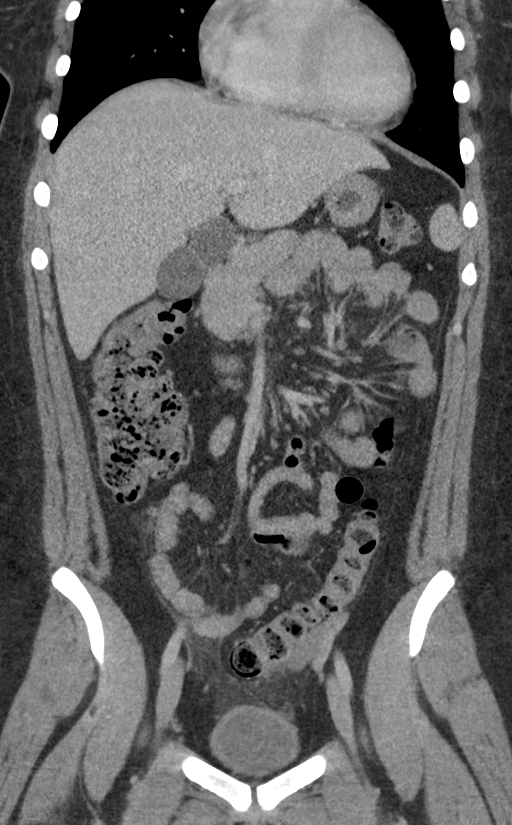
[im 67/151  soft-tissue]
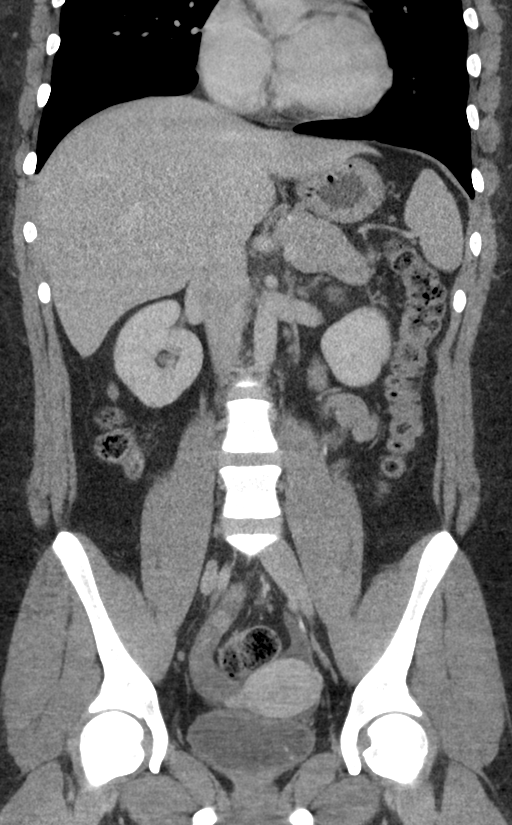
[im 84/151  soft-tissue]
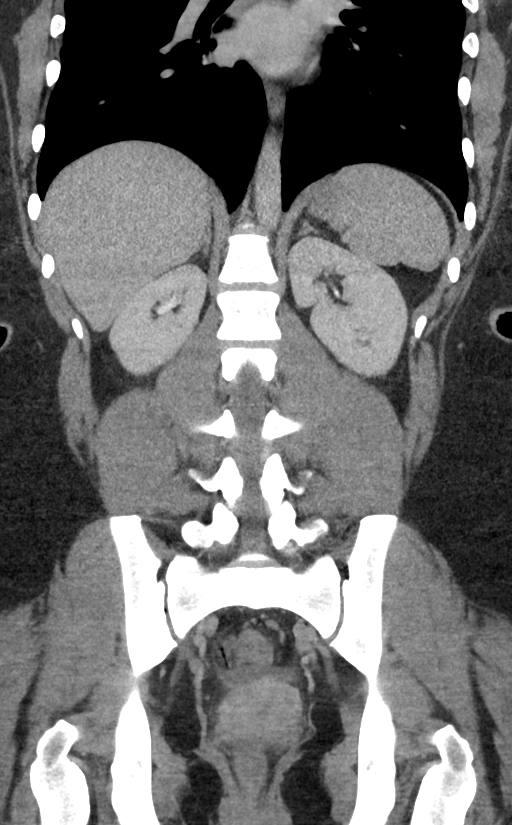

[15 of 46 positions shown; findings below may reference images not displayed]

FINDINGS: Lower chest: Lung bases are clear.  No pleural effusions.

Hepatobiliary: Normal appearance of the liver, gallbladder and
portal venous system.

Pancreas: Normal appearance of the pancreas without inflammation or
duct dilatation.

Spleen: Normal appearance of spleen without enlargement.

Adrenals/Urinary Tract: Normal adrenal glands. Small amount of
contrast within the renal calices and limited evaluation for kidney
stones. No suspicious renal lesions and no hydronephrosis. Left
ureter jet is seen. Urinary bladder is unremarkable.

Stomach/Bowel: Normal appearance of the stomach and duodenum. Normal
appendix. No evidence for bowel dilatation or focal inflammation.

Vascular/Lymphatic: No significant vascular findings are present. No
enlarged abdominal or pelvic lymph nodes.

Reproductive: There is fluid surrounding the uterus and there is
mild fat stranding just anterior to the uterine fundus. No gross
abnormality to the adnexal structures. Incidentally, an intravaginal
tampon device present.

Other: Small amount of free fluid in the pelvis, particularly around
the uterus. No significant abdominal wall hernia.

Musculoskeletal: No acute bone abnormality.
IMPRESSION: Small amount of stranding and fluid surrounding the uterus. Fluid
could be physiologic but cannot exclude pelvic inflammatory disease
and recommend clinical correlation in this area. If this is the area
of concern, consider further imaging with pelvic ultrasound.
